# Patient Record
Sex: Female | Born: 2005 | Race: White | Hispanic: No | Marital: Single | Smoking: Never smoker
Health system: Southern US, Community
[De-identification: ages and names within clinical notes are randomized; demographics above are authoritative.]

---
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
## 2023-09-11 ENCOUNTER — Ambulatory Visit: Payer: No Typology Code available for payment source | Admitting: Internal Medicine
